# Patient Record
Sex: Female | Born: 1990 | Race: Black or African American | Hispanic: No | Marital: Single | Smoking: Never smoker
Health system: Southern US, Community
[De-identification: ages and names within clinical notes are randomized; demographics above are authoritative.]

## PROBLEM LIST (undated history)

## (undated) DIAGNOSIS — S3282XA Multiple fractures of pelvis without disruption of pelvic ring, initial encounter for closed fracture: Secondary | ICD-10-CM

## (undated) DIAGNOSIS — S42102A Fracture of unspecified part of scapula, left shoulder, initial encounter for closed fracture: Secondary | ICD-10-CM

---

## 2018-02-26 ENCOUNTER — Emergency Department (HOSPITAL_COMMUNITY): Payer: Managed Care, Other (non HMO)

## 2018-02-26 ENCOUNTER — Inpatient Hospital Stay (HOSPITAL_COMMUNITY)
Admission: EM | Admit: 2018-02-26 | Discharge: 2018-03-01 | DRG: 552 | Disposition: A | Discharge status: Home Health | Payer: Managed Care, Other (non HMO) | Attending: General Surgery | Admitting: General Surgery

## 2018-02-26 ENCOUNTER — Other Ambulatory Visit: Payer: Self-pay

## 2018-02-26 ENCOUNTER — Encounter (HOSPITAL_COMMUNITY): Payer: Self-pay | Admitting: *Deleted

## 2018-02-26 DIAGNOSIS — S32111A Minimally displaced Zone I fracture of sacrum, initial encounter for closed fracture: Secondary | ICD-10-CM | POA: Diagnosis present

## 2018-02-26 DIAGNOSIS — F10129 Alcohol abuse with intoxication, unspecified: Secondary | ICD-10-CM | POA: Diagnosis present

## 2018-02-26 DIAGNOSIS — E872 Acidosis: Secondary | ICD-10-CM | POA: Diagnosis present

## 2018-02-26 DIAGNOSIS — D62 Acute posthemorrhagic anemia: Secondary | ICD-10-CM | POA: Diagnosis present

## 2018-02-26 DIAGNOSIS — S42192A Fracture of other part of scapula, left shoulder, initial encounter for closed fracture: Secondary | ICD-10-CM | POA: Diagnosis present

## 2018-02-26 DIAGNOSIS — F319 Bipolar disorder, unspecified: Secondary | ICD-10-CM | POA: Diagnosis present

## 2018-02-26 DIAGNOSIS — R739 Hyperglycemia, unspecified: Secondary | ICD-10-CM | POA: Diagnosis present

## 2018-02-26 DIAGNOSIS — S3210XA Unspecified fracture of sacrum, initial encounter for closed fracture: Secondary | ICD-10-CM | POA: Diagnosis present

## 2018-02-26 DIAGNOSIS — S134XXA Sprain of ligaments of cervical spine, initial encounter: Secondary | ICD-10-CM

## 2018-02-26 DIAGNOSIS — D649 Anemia, unspecified: Secondary | ICD-10-CM

## 2018-02-26 DIAGNOSIS — S42102A Fracture of unspecified part of scapula, left shoulder, initial encounter for closed fracture: Secondary | ICD-10-CM | POA: Diagnosis present

## 2018-02-26 DIAGNOSIS — S3282XA Multiple fractures of pelvis without disruption of pelvic ring, initial encounter for closed fracture: Secondary | ICD-10-CM

## 2018-02-26 DIAGNOSIS — Y92410 Unspecified street and highway as the place of occurrence of the external cause: Secondary | ICD-10-CM

## 2018-02-26 DIAGNOSIS — M545 Low back pain: Secondary | ICD-10-CM | POA: Diagnosis present

## 2018-02-26 DIAGNOSIS — R579 Shock, unspecified: Secondary | ICD-10-CM | POA: Diagnosis present

## 2018-02-26 DIAGNOSIS — S32810A Multiple fractures of pelvis with stable disruption of pelvic ring, initial encounter for closed fracture: Secondary | ICD-10-CM | POA: Diagnosis present

## 2018-02-26 DIAGNOSIS — S0091XA Abrasion of unspecified part of head, initial encounter: Secondary | ICD-10-CM | POA: Diagnosis present

## 2018-02-26 DIAGNOSIS — S329XXA Fracture of unspecified parts of lumbosacral spine and pelvis, initial encounter for closed fracture: Secondary | ICD-10-CM

## 2018-02-26 HISTORY — DX: Multiple fractures of pelvis without disruption of pelvic ring, initial encounter for closed fracture: S32.82XA

## 2018-02-26 HISTORY — DX: Fracture of unspecified part of scapula, left shoulder, initial encounter for closed fracture: S42.102A

## 2018-02-26 LAB — I-STAT CG4 LACTIC ACID, ED: Lactic Acid, Venous: 6.6 mmol/L (ref 0.5–1.9)

## 2018-02-26 LAB — CBC
HEMATOCRIT: 25.7 % — AB (ref 36.0–46.0)
HEMATOCRIT: 30.4 % — AB (ref 36.0–46.0)
HEMOGLOBIN: 7.1 g/dL — AB (ref 12.0–15.0)
Hemoglobin: 8.7 g/dL — ABNORMAL LOW (ref 12.0–15.0)
MCH: 18.9 pg — ABNORMAL LOW (ref 26.0–34.0)
MCH: 21.5 pg — AB (ref 26.0–34.0)
MCHC: 27.6 g/dL — AB (ref 30.0–36.0)
MCHC: 28.6 g/dL — ABNORMAL LOW (ref 30.0–36.0)
MCV: 68.5 fL — ABNORMAL LOW (ref 78.0–100.0)
MCV: 75.1 fL — AB (ref 78.0–100.0)
Platelets: 435 10*3/uL — ABNORMAL HIGH (ref 150–400)
Platelets: 347 10*3/uL (ref 150–400)
RBC: 3.75 MIL/uL — ABNORMAL LOW (ref 3.87–5.11)
RBC: 4.05 MIL/uL (ref 3.87–5.11)
RDW: 18.6 % — AB (ref 11.5–15.5)
RDW: 22.1 % — AB (ref 11.5–15.5)
WBC: 17.8 10*3/uL — AB (ref 4.0–10.5)
WBC: 11.3 10*3/uL — ABNORMAL HIGH (ref 4.0–10.5)

## 2018-02-26 LAB — URINALYSIS, ROUTINE W REFLEX MICROSCOPIC
Bilirubin Urine: NEGATIVE
GLUCOSE, UA: NEGATIVE mg/dL
Ketones, ur: NEGATIVE mg/dL
NITRITE: NEGATIVE
PROTEIN: NEGATIVE mg/dL
Specific Gravity, Urine: 1.024 (ref 1.005–1.030)
pH: 7 (ref 5.0–8.0)

## 2018-02-26 LAB — COMPREHENSIVE METABOLIC PANEL
ALK PHOS: 52 U/L (ref 38–126)
ALT: 38 U/L (ref 0–44)
ANION GAP: 13 (ref 5–15)
AST: 75 U/L — ABNORMAL HIGH (ref 15–41)
Albumin: 3.9 g/dL (ref 3.5–5.0)
BILIRUBIN TOTAL: 0.5 mg/dL (ref 0.3–1.2)
BUN: 11 mg/dL (ref 6–20)
CALCIUM: 8.8 mg/dL — AB (ref 8.9–10.3)
CO2: 20 mmol/L — ABNORMAL LOW (ref 22–32)
Chloride: 108 mmol/L (ref 98–111)
Creatinine, Ser: 1.28 mg/dL — ABNORMAL HIGH (ref 0.44–1.00)
GFR calc Af Amer: >60 mL/min (ref >60)
GFR, EST NON AFRICAN AMERICAN: 56 mL/min — AB (ref >60)
Glucose, Bld: 115 mg/dL — ABNORMAL HIGH (ref 70–99)
POTASSIUM: 3 mmol/L — AB (ref 3.5–5.1)
Sodium: 141 mmol/L (ref 135–145)
TOTAL PROTEIN: 6.4 g/dL — AB (ref 6.5–8.1)

## 2018-02-26 LAB — I-STAT CHEM 8, ED
BUN: 11 mg/dL (ref 6–20)
CALCIUM ION: 1.12 mmol/L — AB (ref 1.15–1.40)
CHLORIDE: 106 mmol/L (ref 98–111)
CREATININE: 1.5 mg/dL — AB (ref 0.44–1.00)
Glucose, Bld: 108 mg/dL — ABNORMAL HIGH (ref 70–99)
HCT: 26 % — ABNORMAL LOW (ref 36.0–46.0)
Hemoglobin: 8.8 g/dL — ABNORMAL LOW (ref 12.0–15.0)
Potassium: 3 mmol/L — ABNORMAL LOW (ref 3.5–5.1)
Sodium: 141 mmol/L (ref 135–145)
TCO2: 19 mmol/L — AB (ref 22–32)

## 2018-02-26 LAB — PROTIME-INR
INR: 1.11
PROTHROMBIN TIME: 14.3 s (ref 11.4–15.2)

## 2018-02-26 LAB — ETHANOL: ALCOHOL ETHYL (B): 168 mg/dL — AB (ref <10)

## 2018-02-26 LAB — MRSA PCR SCREENING: MRSA by PCR: NEGATIVE

## 2018-02-26 LAB — ABO/RH: ABO/RH(D): B POS

## 2018-02-26 LAB — I-STAT BETA HCG BLOOD, ED (MC, WL, AP ONLY)

## 2018-02-26 LAB — BLOOD PRODUCT ORDER (VERBAL) VERIFICATION

## 2018-02-26 MED ORDER — OXYCODONE HCL 5 MG PO TABS
5.0000 mg | ORAL_TABLET | ORAL | Status: DC | PRN
  Administered 2018-02-26 – 2018-02-27 (×2): 5 mg via ORAL
  Filled 2018-02-26 (×2): qty 1

## 2018-02-26 MED ORDER — ENOXAPARIN SODIUM 40 MG/0.4ML ~~LOC~~ SOLN: 40.0000 mg | SUBCUTANEOUS | Status: DC

## 2018-02-26 MED ORDER — KCL IN DEXTROSE-NACL 20-5-0.45 MEQ/L-%-% IV SOLN
INTRAVENOUS | Status: DC
  Administered 2018-02-26 – 2018-02-28 (×4): via INTRAVENOUS
  Filled 2018-02-26 (×4): qty 1000

## 2018-02-26 MED ORDER — SODIUM CHLORIDE 0.9 % IV BOLUS: 2000.0000 mL | Freq: Once | INTRAVENOUS | Status: DC

## 2018-02-26 MED ORDER — IOHEXOL 300 MG/ML  SOLN
100.0000 mL | Freq: Once | INTRAMUSCULAR | Status: AC | PRN
  Administered 2018-02-26: 100 mL via INTRAVENOUS

## 2018-02-26 MED ORDER — POTASSIUM CHLORIDE CRYS ER 20 MEQ PO TBCR
40.0000 meq | EXTENDED_RELEASE_TABLET | Freq: Once | ORAL | Status: AC
  Administered 2018-02-26: 40 meq via ORAL
  Filled 2018-02-26: qty 2

## 2018-02-26 MED ORDER — ACETAMINOPHEN 500 MG PO TABS
1000.0000 mg | ORAL_TABLET | Freq: Three times a day (TID) | ORAL | Status: DC
  Administered 2018-02-26 – 2018-03-01 (×9): 1000 mg via ORAL
  Filled 2018-02-26 (×10): qty 2

## 2018-02-26 MED ORDER — HYDROMORPHONE HCL 1 MG/ML IJ SOLN
0.5000 mg | INTRAMUSCULAR | Status: DC | PRN
  Administered 2018-02-26 – 2018-03-01 (×6): 0.5 mg via INTRAVENOUS
  Filled 2018-02-26 (×6): qty 1

## 2018-02-26 MED ORDER — ONDANSETRON HCL 4 MG/2ML IJ SOLN
4.0000 mg | Freq: Four times a day (QID) | INTRAMUSCULAR | Status: DC | PRN
Start: 2018-02-26 — End: 2018-03-01
  Administered 2018-02-26 – 2018-02-27 (×3): 4 mg via INTRAVENOUS
  Filled 2018-02-26 (×3): qty 2

## 2018-02-26 MED ORDER — ONDANSETRON 4 MG PO TBDP: 4.0000 mg | ORAL_TABLET | Freq: Four times a day (QID) | ORAL | Status: DC | PRN
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
## 2018-02-26 NOTE — ED Notes (Signed)
Family at bedside. 

## 2018-02-26 NOTE — ED Notes (Signed)
To ct

## 2018-02-26 NOTE — ED Notes (Signed)
bp 95/69

## 2018-02-26 NOTE — ED Notes (Addendum)
Blood infused at [id number]

## 2018-02-26 NOTE — ED Notes (Signed)
Dr Donell Beersbyerly her to see the pt

## 2018-02-26 NOTE — ED Notes (Signed)
Pt returned from ct
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
## 2018-02-26 NOTE — Progress Notes (Signed)
Rehab Admissions Coordinator Note:  Patient was screened by Clois DupesBoyette, Ernestene Coover Godwin for appropriateness for an Inpatient Acute Rehab Consult per PT and OT recommendation.   At this time, we are recommending Inpatient Rehab consult if pt would like to be considered for admit. Please advise.  Clois DupesBoyette, Rashelle Ireland Godwin 02/26/2018, 8:39 PM  I can be reached at [phone].
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
## 2018-02-26 NOTE — ED Notes (Signed)
MD Wyatt at bedside.  
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
## 2018-02-26 NOTE — ED Notes (Signed)
Blood oneg 1st ubit [id number]

## 2018-02-26 NOTE — ED Notes (Signed)
Pt alert and oriented still c/o being cold

## 2018-02-26 NOTE — ED Notes (Signed)
Dr Rhunette CroftNanavati informed of lactic acid results 6.60
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
## 2018-02-26 NOTE — ED Notes (Signed)
pts lmp last week

## 2018-02-26 NOTE — ED Notes (Signed)
Portable shoulder xrays lt
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
## 2018-02-26 NOTE — ED Notes (Signed)
Iv rt a-c by emilie rn

## 2018-02-27 LAB — BPAM FFP
BLOOD PRODUCT EXPIRATION DATE: 201908032359
BLOOD PRODUCT EXPIRATION DATE: 201908032359
Blood Product Expiration Date: 201908172359
Blood Product Expiration Date: 201908202359
ISSUE DATE / TIME: 201907290540
ISSUE DATE / TIME: 201907290540
UNIT TYPE AND RH: 6200
UNIT TYPE AND RH: 7300
UNIT TYPE AND RH: 7300
Unit Type and Rh: 600

## 2018-02-27 LAB — TYPE AND SCREEN
ABO/RH(D): B POS
ANTIBODY SCREEN: NEGATIVE
Unit division: 0
Unit division: 0

## 2018-02-27 LAB — BASIC METABOLIC PANEL
Anion gap: 5 (ref 5–15)
BUN: 6 mg/dL (ref 6–20)
CALCIUM: 7.9 mg/dL — AB (ref 8.9–10.3)
CHLORIDE: 112 mmol/L — AB (ref 98–111)
CO2: 21 mmol/L — AB (ref 22–32)
CREATININE: 0.73 mg/dL (ref 0.44–1.00)
GFR calc non Af Amer: >60 mL/min (ref >60)
GLUCOSE: 103 mg/dL — AB (ref 70–99)
Potassium: 4 mmol/L (ref 3.5–5.1)
Sodium: 138 mmol/L (ref 135–145)

## 2018-02-27 LAB — CBC
HEMATOCRIT: 29 % — AB (ref 36.0–46.0)
HEMOGLOBIN: 8.4 g/dL — AB (ref 12.0–15.0)
MCH: 21 pg — AB (ref 26.0–34.0)
MCHC: 29 g/dL — AB (ref 30.0–36.0)
MCV: 72.5 fL — ABNORMAL LOW (ref 78.0–100.0)
Platelets: 298 10*3/uL (ref 150–400)
RBC: 4 MIL/uL (ref 3.87–5.11)
RDW: 22.1 % — ABNORMAL HIGH (ref 11.5–15.5)
WBC: 8.1 10*3/uL (ref 4.0–10.5)

## 2018-02-27 LAB — BPAM RBC
BLOOD PRODUCT EXPIRATION DATE: 201908252359
Blood Product Expiration Date: 201909012359
ISSUE DATE / TIME: 201907290539
ISSUE DATE / TIME: 201907290539
UNIT TYPE AND RH: 9500
Unit Type and Rh: 9500

## 2018-02-27 LAB — PREPARE FRESH FROZEN PLASMA
UNIT DIVISION: 0
UNIT DIVISION: 0
UNIT DIVISION: 0
Unit division: 0

## 2018-02-27 LAB — GLUCOSE, CAPILLARY
GLUCOSE-CAPILLARY: 89 mg/dL (ref 70–99)
Glucose-Capillary: 118 mg/dL — ABNORMAL HIGH (ref 70–99)

## 2018-02-27 MED ORDER — ENOXAPARIN SODIUM 40 MG/0.4ML ~~LOC~~ SOLN: 40.0000 mg | SUBCUTANEOUS | Status: DC

## 2018-02-27 MED ORDER — OXYCODONE HCL 5 MG PO TABS
5.0000 mg | ORAL_TABLET | ORAL | Status: DC | PRN
  Administered 2018-02-27 (×2): 5 mg via ORAL
  Administered 2018-02-27 – 2018-02-28 (×4): 10 mg via ORAL
  Filled 2018-02-27 (×2): qty 1
  Filled 2018-02-27 (×4): qty 2

## 2018-02-27 MED ORDER — METHOCARBAMOL 500 MG PO TABS
500.0000 mg | ORAL_TABLET | Freq: Three times a day (TID) | ORAL | Status: DC
  Administered 2018-02-27 – 2018-03-01 (×8): 500 mg via ORAL
  Filled 2018-02-27 (×8): qty 1

## 2018-02-27 MED ORDER — ENOXAPARIN SODIUM 40 MG/0.4ML ~~LOC~~ SOLN
40.0000 mg | SUBCUTANEOUS | Status: DC
  Administered 2018-02-27 – 2018-02-28 (×2): 40 mg via SUBCUTANEOUS
  Filled 2018-02-27 (×3): qty 0.4

## 2018-02-27 NOTE — Progress Notes (Signed)
Orthopedic Tech Progress Note Patient Details:  Christine Parsons 04/04/1991 [id number]  Ortho Devices Type of Ortho Device: Arm sling Ortho Device/Splint Location: lue Ortho Device/Splint Interventions: Application   Post Interventions Patient Tolerated: Well Instructions Provided: Care of device   Nikki DomCrawford, Domitila Stetler 02/27/2018, 4:27 PM
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
## 2018-02-27 NOTE — Progress Notes (Signed)
Inpatient Rehabilitation Admissions Coordinator  Noted with better pain control PT recommendation is now Home with HH. Do not feel patient will be in need of an inpatient rehab admit at this level.  Ottie GlazierBarbara Shellia Hartl, RN, MSN Rehab Admissions Coordinator [phone] 02/27/2018 7:49 PM

## 2018-02-28 ENCOUNTER — Encounter (HOSPITAL_COMMUNITY): Payer: Self-pay | Admitting: Orthopedic Surgery

## 2018-02-28 DIAGNOSIS — S42102A Fracture of unspecified part of scapula, left shoulder, initial encounter for closed fracture: Secondary | ICD-10-CM

## 2018-02-28 HISTORY — DX: Fracture of unspecified part of scapula, left shoulder, initial encounter for closed fracture: S42.102A

## 2018-02-28 LAB — BASIC METABOLIC PANEL
Anion gap: 8 (ref 5–15)
CALCIUM: 8.1 mg/dL — AB (ref 8.9–10.3)
CO2: 22 mmol/L (ref 22–32)
Chloride: 109 mmol/L (ref 98–111)
Creatinine, Ser: 0.69 mg/dL (ref 0.44–1.00)
GFR calc Af Amer: >60 mL/min (ref >60)
GLUCOSE: 104 mg/dL — AB (ref 70–99)
Potassium: 4.2 mmol/L (ref 3.5–5.1)
Sodium: 139 mmol/L (ref 135–145)

## 2018-02-28 LAB — CBC
HEMATOCRIT: 31.6 % — AB (ref 36.0–46.0)
HEMOGLOBIN: 9.1 g/dL — AB (ref 12.0–15.0)
MCH: 21.1 pg — AB (ref 26.0–34.0)
MCHC: 28.8 g/dL — ABNORMAL LOW (ref 30.0–36.0)
MCV: 73.1 fL — AB (ref 78.0–100.0)
PLATELETS: 324 10*3/uL (ref 150–400)
RBC: 4.32 MIL/uL (ref 3.87–5.11)
RDW: 22.6 % — AB (ref 11.5–15.5)
WBC: 7.7 10*3/uL (ref 4.0–10.5)

## 2018-02-28 LAB — CDS SEROLOGY
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
## 2018-02-28 NOTE — Progress Notes (Signed)
Trauma Service Note  Subjective: Patient doing well with OT/PT.  Report of possible scapular fracture on the left, but no report of racture on dedicated left shoulder X-rays.  Will review  Objective: Vital signs in last 24 hours: Temp:  [97.7 F (36.5 C)-98.6 F (37 C)] 98.3 F (36.8 C) (07/31 0800) Pulse Rate:  [72-89] 77 (07/31 0800) Resp:  [18-26] 20 (07/31 0800) BP: (116-137)/(71-96) 116/78 (07/31 0800) SpO2:  [91 %-100 %] 93 % (07/31 0800) Last BM Date: 02/26/18  Intake/Output from previous day: 07/30 0701 - 07/31 0700 In: 360 [P.O.:360] Out: 1325 [Urine:1325] Intake/Output this shift: Total I/O In: 3075 [I.V.:3075] Out: 650 [Urine:650]  General: No distress.  Has not been out of bed with therapies today yet.  Lungs: Clear  Abd: Benign  Extremities: No changes  Neuro: INtact  Lab Results: CBC  Recent Labs    02/27/18 0359 02/28/18 0351  WBC 8.1 7.7  HGB 8.4* 9.1*  HCT 29.0* 31.6*  PLT 298 324   BMET Recent Labs    02/27/18 0359 02/28/18 0351  NA 138 139  K 4.0 4.2  CL 112* 109  CO2 21* 22  GLUCOSE 103* 104*  BUN 6 <5*  CREATININE 0.73 0.69  CALCIUM 7.9* 8.1*   PT/INR Recent Labs    02/26/18 0534  LABPROT 14.3  INR 1.11   ABG No results for input(s): PHART, HCO3 in the last 72 hours.  Invalid input(s): PCO2, PO2  Studies/Results: No results found.  Anti-infectives: Anti-infectives (From admission, onward)   None      Assessment/Plan: s/p  Advance diet Possible home today after cleared by therapies.  LOS: 0 days   Marta LamasJames O. Gae BonWyatt, III, MD, FACS [phone] Trauma Surgeon 02/28/2018

## 2018-02-28 NOTE — Discharge Instructions (Signed)
Orthopaedic DC instructions  weighbear as tolerated left leg and left upper extremity  No motion restrictions Recommend walker or crutches for 3-4 weeks  Would rather you walk normally with crutches or walker than with a limp without them  Call office with questions: [phone]
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
## 2018-02-28 NOTE — Progress Notes (Signed)
Patient was able to make significant progress with mobilization yesterday as expected. There does not appear to be a role for surgical fixation at this time. Continue diet.  Will see later this morning. Appreciate PT efforts!  Christine GalasMichael Dhyana Bastone, MD [facility], [facility] [phone]

## 2018-03-01 MED ORDER — GLYCERIN (LAXATIVE) 2.1 G RE SUPP
1.0000 | Freq: Once | RECTAL | Status: AC
  Administered 2018-03-01: 1 via RECTAL
  Filled 2018-03-01: qty 1

## 2018-03-01 MED ORDER — METHOCARBAMOL 500 MG PO TABS: 500.0000 mg | ORAL_TABLET | Freq: Three times a day (TID) | ORAL | 0 refills | Status: AC

## 2018-03-01 MED ORDER — OXYCODONE HCL 5 MG PO TABS: 5.0000 mg | ORAL_TABLET | ORAL | 0 refills | Status: DC | PRN

## 2018-03-01 MED ORDER — ACETAMINOPHEN 500 MG PO TABS: 1000.0000 mg | ORAL_TABLET | Freq: Four times a day (QID) | ORAL | 0 refills | Status: AC | PRN

## 2018-03-01 MED ORDER — OXYCODONE HCL 5 MG PO TABS: 5.0000 mg | ORAL_TABLET | ORAL | 0 refills | Status: AC | PRN
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
## 2018-03-16 ENCOUNTER — Ambulatory Visit (INDEPENDENT_AMBULATORY_CARE_PROVIDER_SITE_OTHER): Payer: Managed Care, Other (non HMO) | Admitting: Orthopaedic Surgery

## 2018-03-16 ENCOUNTER — Encounter (INDEPENDENT_AMBULATORY_CARE_PROVIDER_SITE_OTHER): Payer: Self-pay | Admitting: Orthopaedic Surgery

## 2018-03-16 DIAGNOSIS — S3282XD Multiple fractures of pelvis without disruption of pelvic ring, subsequent encounter for fracture with routine healing: Secondary | ICD-10-CM

## 2018-03-16 MED ORDER — ACETAMINOPHEN 500 MG PO TABS: 500.0000 mg | ORAL_TABLET | Freq: Four times a day (QID) | ORAL | 0 refills | Status: AC | PRN
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
## 2018-03-21 DIAGNOSIS — S3210XA Unspecified fracture of sacrum, initial encounter for closed fracture: Secondary | ICD-10-CM | POA: Diagnosis present
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
